# Patient Record
Sex: Female | Born: 1980 | Race: White | Hispanic: No | Marital: Married | Smoking: Never smoker
Health system: Southern US, Community
[De-identification: ages and names within clinical notes are randomized; demographics above are authoritative.]

## PROBLEM LIST (undated history)

## (undated) DIAGNOSIS — R112 Nausea with vomiting, unspecified: Secondary | ICD-10-CM

## (undated) DIAGNOSIS — Z1589 Genetic susceptibility to other disease: Secondary | ICD-10-CM

## (undated) DIAGNOSIS — E7212 Methylenetetrahydrofolate reductase deficiency: Secondary | ICD-10-CM

## (undated) DIAGNOSIS — R87629 Unspecified abnormal cytological findings in specimens from vagina: Secondary | ICD-10-CM

## (undated) DIAGNOSIS — Z9889 Other specified postprocedural states: Secondary | ICD-10-CM

## (undated) HISTORY — DX: Other specified postprocedural states: Z98.890

## (undated) HISTORY — PX: DILATION AND CURETTAGE OF UTERUS: SHX78

## (undated) HISTORY — DX: Methylenetetrahydrofolate reductase deficiency: E72.12

## (undated) HISTORY — DX: Unspecified abnormal cytological findings in specimens from vagina: R87.629

## (undated) HISTORY — DX: Genetic susceptibility to other disease: Z15.89

## (undated) HISTORY — DX: Nausea with vomiting, unspecified: R11.2

## (undated) HISTORY — PX: APPENDECTOMY: SHX54

## (undated) HISTORY — PX: LEEP: SHX91

---

## 2016-01-06 LAB — OB RESULTS CONSOLE ANTIBODY SCREEN: Antibody Screen: NEGATIVE

## 2016-01-06 LAB — OB RESULTS CONSOLE ABO/RH: RH TYPE: POSITIVE

## 2016-01-06 LAB — OB RESULTS CONSOLE HEPATITIS B SURFACE ANTIGEN: Hepatitis B Surface Ag: NEGATIVE

## 2016-01-06 LAB — OB RESULTS CONSOLE RUBELLA ANTIBODY, IGM: Rubella: IMMUNE

## 2016-01-06 LAB — OB RESULTS CONSOLE GC/CHLAMYDIA
Chlamydia: NEGATIVE
Gonorrhea: NEGATIVE

## 2016-01-06 LAB — OB RESULTS CONSOLE RPR: RPR: NONREACTIVE

## 2016-01-06 LAB — OB RESULTS CONSOLE HIV ANTIBODY (ROUTINE TESTING): HIV: NONREACTIVE

## 2016-06-21 NOTE — L&D Delivery Note (Signed)
Delivery Note At 9:32 PM a viable and healthy female was delivered via Vaginal, Spontaneous Delivery (Presentation: LOA  ).  APGAR: 8, 9; weight  pending.   Placenta status: spontaneous , intact.  Cord:  with the following complications: .  Cord pH: na  Anesthesia: local  Episiotomy: Median- maternal request Lacerations: 2nd degree;Perineal Suture Repair: 2.0 vicryl rapide Est. Blood Loss (mL):  250  Mom to postpartum.  Baby to Couplet care / Skin to Skin.  Nicci Vaughan J 08/03/2016, 9:52 PM

## 2016-07-02 LAB — OB RESULTS CONSOLE GBS: STREP GROUP B AG: NEGATIVE

## 2016-08-02 ENCOUNTER — Encounter (HOSPITAL_COMMUNITY): Payer: Self-pay | Admitting: *Deleted

## 2016-08-02 ENCOUNTER — Telehealth (HOSPITAL_COMMUNITY): Payer: Self-pay | Admitting: *Deleted

## 2016-08-02 ENCOUNTER — Other Ambulatory Visit: Payer: Self-pay | Admitting: Obstetrics

## 2016-08-02 NOTE — Telephone Encounter (Signed)
Preadmission screen  

## 2016-08-03 ENCOUNTER — Encounter (HOSPITAL_COMMUNITY): Payer: Self-pay | Admitting: *Deleted

## 2016-08-03 ENCOUNTER — Inpatient Hospital Stay (HOSPITAL_COMMUNITY)
Admission: AD | Admit: 2016-08-03 | Discharge: 2016-08-05 | DRG: 775 | Disposition: A | Discharge status: Home / Self Care | Payer: BC Managed Care – PPO | Source: Ambulatory Visit | Attending: Obstetrics and Gynecology | Admitting: Obstetrics and Gynecology

## 2016-08-03 DIAGNOSIS — Z3A40 40 weeks gestation of pregnancy: Secondary | ICD-10-CM

## 2016-08-03 DIAGNOSIS — O4292 Full-term premature rupture of membranes, unspecified as to length of time between rupture and onset of labor: Secondary | ICD-10-CM | POA: Diagnosis present

## 2016-08-03 DIAGNOSIS — O4290 Premature rupture of membranes, unspecified as to length of time between rupture and onset of labor, unspecified weeks of gestation: Secondary | ICD-10-CM | POA: Diagnosis present

## 2016-08-03 LAB — CBC
HEMATOCRIT: 38.3 % (ref 36.0–46.0)
HEMOGLOBIN: 13.3 g/dL (ref 12.0–15.0)
MCH: 30.9 pg (ref 26.0–34.0)
MCHC: 34.7 g/dL (ref 30.0–36.0)
MCV: 88.9 fL (ref 78.0–100.0)
Platelets: 167 10*3/uL (ref 150–400)
RBC: 4.31 MIL/uL (ref 3.87–5.11)
RDW: 13.2 % (ref 11.5–15.5)
WBC: 11.5 10*3/uL — ABNORMAL HIGH (ref 4.0–10.5)

## 2016-08-03 LAB — TYPE AND SCREEN
ABO/RH(D): O POS
Antibody Screen: NEGATIVE

## 2016-08-03 MED ORDER — SIMETHICONE 80 MG PO CHEW: 80.0000 mg | CHEWABLE_TABLET | ORAL | Status: DC | PRN

## 2016-08-03 MED ORDER — ONDANSETRON HCL 4 MG/2ML IJ SOLN: 4.0000 mg | Freq: Four times a day (QID) | INTRAMUSCULAR | Status: DC | PRN

## 2016-08-03 MED ORDER — PRENATAL MULTIVITAMIN CH
1.0000 | ORAL_TABLET | Freq: Every day | ORAL | Status: DC
  Filled 2016-08-03: qty 1

## 2016-08-03 MED ORDER — SENNOSIDES-DOCUSATE SODIUM 8.6-50 MG PO TABS
2.0000 | ORAL_TABLET | ORAL | Status: DC
  Administered 2016-08-04 (×2): 2 via ORAL
  Filled 2016-08-03 (×2): qty 2

## 2016-08-03 MED ORDER — ACETAMINOPHEN 325 MG PO TABS
650.0000 mg | ORAL_TABLET | ORAL | Status: DC | PRN
Start: 2016-08-03 — End: 2016-08-05
  Administered 2016-08-04 (×2): 650 mg via ORAL
  Filled 2016-08-03 (×2): qty 2

## 2016-08-03 MED ORDER — OXYCODONE-ACETAMINOPHEN 5-325 MG PO TABS: 1.0000 | ORAL_TABLET | ORAL | Status: DC | PRN

## 2016-08-03 MED ORDER — OXYCODONE-ACETAMINOPHEN 5-325 MG PO TABS: 2.0000 | ORAL_TABLET | ORAL | Status: DC | PRN

## 2016-08-03 MED ORDER — IBUPROFEN 600 MG PO TABS
600.0000 mg | ORAL_TABLET | Freq: Four times a day (QID) | ORAL | Status: DC
  Administered 2016-08-04 – 2016-08-05 (×7): 600 mg via ORAL
  Filled 2016-08-03 (×7): qty 1

## 2016-08-03 MED ORDER — OXYTOCIN 40 UNITS IN LACTATED RINGERS INFUSION - SIMPLE MED
1.0000 m[IU]/min | INTRAVENOUS | Status: DC
  Administered 2016-08-03: 1 m[IU]/min via INTRAVENOUS

## 2016-08-03 MED ORDER — OXYTOCIN 40 UNITS IN LACTATED RINGERS INFUSION - SIMPLE MED
2.5000 [IU]/h | INTRAVENOUS | Status: DC
  Administered 2016-08-03: 39.96 [IU]/h via INTRAVENOUS
  Filled 2016-08-03: qty 1000

## 2016-08-03 MED ORDER — ONDANSETRON HCL 4 MG PO TABS: 4.0000 mg | ORAL_TABLET | ORAL | Status: DC | PRN

## 2016-08-03 MED ORDER — METHYLERGONOVINE MALEATE 0.2 MG/ML IJ SOLN: 0.2000 mg | INTRAMUSCULAR | Status: DC | PRN

## 2016-08-03 MED ORDER — ACETAMINOPHEN 325 MG PO TABS: 650.0000 mg | ORAL_TABLET | ORAL | Status: DC | PRN

## 2016-08-03 MED ORDER — OXYTOCIN BOLUS FROM INFUSION: 500.0000 mL | Freq: Once | INTRAVENOUS | Status: DC

## 2016-08-03 MED ORDER — METHYLERGONOVINE MALEATE 0.2 MG PO TABS: 0.2000 mg | ORAL_TABLET | ORAL | Status: DC | PRN

## 2016-08-03 MED ORDER — LACTATED RINGERS IV SOLN
INTRAVENOUS | Status: DC
  Administered 2016-08-03: 19:00:00 via INTRAVENOUS

## 2016-08-03 MED ORDER — TETANUS-DIPHTH-ACELL PERTUSSIS 5-2.5-18.5 LF-MCG/0.5 IM SUSP: 0.5000 mL | Freq: Once | INTRAMUSCULAR | Status: DC

## 2016-08-03 MED ORDER — LACTATED RINGERS IV SOLN: 500.0000 mL | INTRAVENOUS | Status: DC | PRN

## 2016-08-03 MED ORDER — TERBUTALINE SULFATE 1 MG/ML IJ SOLN
0.2500 mg | Freq: Once | INTRAMUSCULAR | Status: DC | PRN
  Filled 2016-08-03: qty 1

## 2016-08-03 MED ORDER — SOD CITRATE-CITRIC ACID 500-334 MG/5ML PO SOLN: 30.0000 mL | ORAL | Status: DC | PRN

## 2016-08-03 MED ORDER — WITCH HAZEL-GLYCERIN EX PADS
1.0000 "application " | MEDICATED_PAD | CUTANEOUS | Status: DC | PRN
  Administered 2016-08-04: 1 via TOPICAL

## 2016-08-03 MED ORDER — DIPHENHYDRAMINE HCL 25 MG PO CAPS: 25.0000 mg | ORAL_CAPSULE | Freq: Four times a day (QID) | ORAL | Status: DC | PRN

## 2016-08-03 MED ORDER — LIDOCAINE HCL (PF) 1 % IJ SOLN
30.0000 mL | INTRAMUSCULAR | Status: DC | PRN
  Administered 2016-08-03: 30 mL via SUBCUTANEOUS
  Filled 2016-08-03: qty 30

## 2016-08-03 MED ORDER — ZOLPIDEM TARTRATE 5 MG PO TABS: 5.0000 mg | ORAL_TABLET | Freq: Every evening | ORAL | Status: DC | PRN

## 2016-08-03 MED ORDER — DIBUCAINE 1 % RE OINT
1.0000 "application " | TOPICAL_OINTMENT | RECTAL | Status: DC | PRN
  Administered 2016-08-04: 1 via RECTAL
  Filled 2016-08-03: qty 28

## 2016-08-03 MED ORDER — COCONUT OIL OIL
1.0000 "application " | TOPICAL_OIL | Status: DC | PRN
  Administered 2016-08-05: 1 via TOPICAL
  Filled 2016-08-03: qty 120

## 2016-08-03 MED ORDER — BENZOCAINE-MENTHOL 20-0.5 % EX AERO
1.0000 "application " | INHALATION_SPRAY | CUTANEOUS | Status: DC | PRN
  Administered 2016-08-04 – 2016-08-05 (×2): 1 via TOPICAL
  Filled 2016-08-03 (×2): qty 56

## 2016-08-03 MED ORDER — ONDANSETRON HCL 4 MG/2ML IJ SOLN: 4.0000 mg | INTRAMUSCULAR | Status: DC | PRN

## 2016-08-03 NOTE — H&P (Addendum)
Betty Reed is a 36 y.o. female presenting for SROM at term. Uncomplicated pregnancy. GBS negative. OB History    Gravida Para Term Preterm AB Living   4 0 0   3 0   SAB TAB Ectopic Multiple Live Births   3             Past Medical History:  Diagnosis Date  . 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 PONV (postoperative nausea and vomiting)   . Vaginal Pap smear, abnormal    Past Surgical History:  Procedure Laterality Date  . APPENDECTOMY    . DILATION AND CURETTAGE OF UTERUS    . LEEP     Family History: family history includes Diabetes in her paternal grandfather; Hypertension in her father. Social History:  reports that she has never smoked. She has never used smokeless tobacco. She reports that she does not drink alcohol or use drugs.     Maternal Diabetes: No Genetic Screening: Normal Maternal Ultrasounds/Referrals: Normal Fetal Ultrasounds or other Referrals:  None Maternal Substance Abuse:  No Significant Maternal Medications:  None Significant Maternal Lab Results:  None Other Comments:  None  Review of Systems  Constitutional: Negative.   All other systems reviewed and are negative.  Maternal Medical History:  Reason for admission: Rupture of membranes.   Contractions: Onset was 3-5 hours ago.   Frequency: irregular.   Perceived severity is mild.    Fetal activity: Perceived fetal activity is normal.   Last perceived fetal movement was within the past hour.    Prenatal complications: no prenatal complications Prenatal Complications - Diabetes: none.      Blood pressure 134/85, pulse 97, temperature 97.5 F (36.4 C), temperature source Oral, resp. rate 18, height 5\' 7"  (1.702 m), weight 89.8 kg (198 lb). Maternal Exam:  Uterine Assessment: Contraction strength is mild.  Contraction frequency is irregular.   Abdomen: Patient reports no abdominal tenderness. Fetal presentation: vertex  Introitus: Normal vulva. Normal vagina.  Ferning test:  positive.  Nitrazine test: positive. Amniotic fluid character: meconium stained.  Pelvis: adequate for delivery.   Cervix: Cervix evaluated by digital exam.     Physical Exam  Nursing note and vitals reviewed. Constitutional: She is oriented to person, place, and time. She appears well-developed and well-nourished.  HENT:  Head: Normocephalic and atraumatic.  Neck: Normal range of motion. Neck supple.  Cardiovascular: Normal rate and regular rhythm.   Respiratory: Effort normal and breath sounds normal.  GI: Soft. Bowel sounds are normal.  Genitourinary: Vagina normal and uterus normal.  Musculoskeletal: Normal range of motion.  Neurological: She is alert and oriented to person, place, and time.  Skin: Skin is warm and dry.  Psychiatric: She has a normal mood and affect.    Prenatal labs: ABO, Rh: --/--/O POS (02/13 1300) Antibody: NEG (02/13 1300) Rubella: Immune (07/18 0000) RPR: Nonreactive (07/18 0000)  HBsAg: Negative (07/18 0000)  HIV: Non-reactive (07/18 0000)  GBS: Negative (01/12 0000)   Assessment/Plan: SROM - meconium at term GBS negative. Category 1 tracing Declines augmentation at this time Delayed management of SROM at tem with light meconium with slight inc in chorio noted although there is no evidence that immediate IOL will reduce this complication. Will allow expectant management at this time.  Kiree Dejarnette J 08/03/2016, 4:39 PM

## 2016-08-03 NOTE — Anesthesia Pain Management Evaluation Note (Signed)
  CRNA Pain Management Visit Note  Patient: Betty Reed, 36 y.o., female  "Hello I am a member of the anesthesia team at [facility]. We have an anesthesia team available at all times to provide care throughout the hospital, including epidural management and anesthesia for C-section. I don't know your plan for the delivery whether it a natural birth, water birth, IV sedation, nitrous supplementation, doula or epidural, but we want to meet your pain goals."   1.Was your pain managed to your expectations on prior hospitalizations?   No prior hospitalizations  2.What is your expectation for pain management during this hospitalization?     Labor support without medications  3.How can we help you reach that goal? Patient wishes a natural childbirth.  Patient knows all options and has no questions.  I advised the patient to let her L&D RN know if she needs anything related to pain control.  Record the patient's initial score and the patient's pain goal.   Pain: 0  Pain Goal: 10 [facility] wants you to be able to say your pain was always managed very well.  Tisa Weisel L 08/03/2016

## 2016-08-03 NOTE — Progress Notes (Signed)
Betty Reed is a 36 y.o. G4P0030 at 3334w4d by LMP admitted for active labor, rupture of membranes  Subjective: comfortable  Objective: BP (!) 139/92   Pulse 89   Temp 98.4 F (36.9 C) (Oral)   Resp 18   Ht 5\' 7"  (1.702 m)   Wt 89.8 kg (198 lb)   BMI 31.01 kg/m  No intake/output data recorded. No intake/output data recorded.  FHT:  FHR: 155 bpm, variability: moderate,  accelerations:  Present,  decelerations:  Absent UC:   irregular, every 7 minutes SVE:   Dilation: 5 Effacement (%): 90 Station: -2 Exam by:: J.Follmer,RNC  Labs: Lab Results  Component Value Date   WBC 11.5 (H) 08/03/2016   HGB 13.3 08/03/2016   HCT 38.3 08/03/2016   MCV 88.9 08/03/2016   PLT 167 08/03/2016    Assessment / Plan: Protracted latent phase  Labor: accepts Pitocin Preeclampsia:  no signs or symptoms of toxicity Fetal Wellbeing:  Category I Pain Control:  Labor support without medications I/D:  n/a Anticipated MOD:  NSVD  Betty Reed J 08/03/2016, 6:33 PM

## 2016-08-04 ENCOUNTER — Encounter (HOSPITAL_COMMUNITY): Payer: Self-pay

## 2016-08-04 LAB — CBC
HCT: 32.2 % — ABNORMAL LOW (ref 36.0–46.0)
HEMOGLOBIN: 11.1 g/dL — AB (ref 12.0–15.0)
MCH: 30.6 pg (ref 26.0–34.0)
MCHC: 34.5 g/dL (ref 30.0–36.0)
MCV: 88.7 fL (ref 78.0–100.0)
PLATELETS: 156 10*3/uL (ref 150–400)
RBC: 3.63 MIL/uL — ABNORMAL LOW (ref 3.87–5.11)
RDW: 13.3 % (ref 11.5–15.5)
WBC: 12.9 10*3/uL — ABNORMAL HIGH (ref 4.0–10.5)

## 2016-08-04 LAB — ABO/RH: ABO/RH(D): O POS

## 2016-08-04 LAB — RPR: RPR Ser Ql: NONREACTIVE

## 2016-08-04 NOTE — Progress Notes (Signed)
Patient ID: Burley SaverAmber Ballew, female   DOB: 08/31/1980, 36 y.o.   MRN: [id number] PPD # 1 SVD  S:  Reports feeling well.             Tolerating po/ No nausea or vomiting             Bleeding is light             Pain controlled with ibuprofen (OTC)             Up ad lib / ambulatory / voiding without difficulties     Information for the patient's newborn:  Tinnie GensBryant, Girl Meeya [[id number]][[id number]]  female "[name] Juanita CraverGrey"   breast feeding    O:  A & O x 3, in no apparent distress              VS:  Vitals:   08/04/16 0041 08/04/16 0440 08/04/16 0555 08/04/16 0940  BP: 127/69 (!) 108/56 134/78 124/69  Pulse: 93 78 70 72  Resp: 18 16 18 18   Temp: 98.3 F (36.8 C) 98.1 F (36.7 C) 97.8 F (36.6 C) 98.1 F (36.7 C)  TempSrc: Axillary Oral Oral Oral  SpO2: 98% 97%  98%  Weight:      Height:        LABS:  Recent Labs  08/03/16 1300 08/04/16 0520  WBC 11.5* 12.9*  HGB 13.3 11.1*  HCT 38.3 32.2*  PLT 167 156    Blood type: O POS (02/13 1300)  Rubella: Immune (07/18 0000)   I&O: I/O last 3 completed shifts: In: -  Out: 450 [Urine:250; Blood:200]          No intake/output data recorded.    Abdomen: soft, non-tender, non-distended             Fundus: firm, non-tender, U-1  Perineum: median episiotomy repair healing well, mild edema  Lochia: minimal  Extremities: No edema, no calf pain or tenderness    A/P: PPD # 1 35 y.o., [id number]   Principal Problem:   Postpartum care following vaginal delivery (2/13) Active Problems:   Delayed delivery after SROM (spontaneous rupture of membranes)   Spontaneous vaginal delivery   Second degree perineal laceration during delivery    Doing well - stable status  Routine post partum orders  Anticipate discharge tomorrow    Raelyn MoraAWSON, Clydine Parkison, M, MSN, CNM 08/04/2016, 8:45 AM

## 2016-08-04 NOTE — Lactation Note (Signed)
This note was copied from a baby's chart. Lactation Consultation Note  Patient Name: Betty Reed Today's Date: 08/04/2016 Reason for consult: Initial assessment  Baby is 17 hours old and has several short attempts to the breast.  @ this consult baby was waking up and showing feeding cues. Mom and dad mentioned the previous feedings baby has been ob and off. LC changed a medium mec stool after 1st attempt  See doc flow sheets for details of attempts. The longest feeding with LC was 5 mins with swallows. LC checked baby's mouth and noted a high palate and at the latch had to assist mom to obtain the depth and once depth was achieved baby latched with few swallows.  LC discussed with mom and dad we may have to use a Nipple Shield for latching if her feeding patterns continue to be on and off. This LC feels the baby is getting the idea of latching and opening her mouth better which each attempt. LC noted the baby to be gasey, and slow to burp. Baby seemed more comfortable in the cross cradle or laid back. Towards the end of working with baby , she seemed like she wasn't interested in feeding. Baby STS on moms chest with a baby blanket covering her back.   Per mom and dad both attended the breast feeding class and are both receptive to teaching.   Mother informed of post-discharge support and given phone number to the lactation department, including services for phone call assistance; out-patient appointments; and breastfeeding support group. List of other breastfeeding resources in the community given in the handout. Encouraged mother to call for problems or concerns related to breastfeeding.    Maternal Data Has patient been taught Hand Expression?: Yes Does the patient have breastfeeding experience prior to this delivery?: No  Feeding Feeding Type: Breast Fed (laid back right breast ) Length of feed: 2 min  LATCH Score/Interventions Latch: Repeated attempts needed to sustain latch,  nipple held in mouth throughout feeding, stimulation needed to elicit sucking reflex. Intervention(s): Adjust position;Assist with latch;Breast massage;Breast compression  Audible Swallowing: A few with stimulation Intervention(s): Alternate breast massage  Type of Nipple: Everted at rest and after stimulation  Comfort (Breast/Nipple): Soft / non-tender     Hold (Positioning): Assistance needed to correctly position infant at breast and maintain latch. Intervention(s): Breastfeeding basics reviewed;Support Pillows;Position options;Skin to skin  LATCH Score: 7  Lactation Tools Discussed/Used Tools: Pump (was given to mom earlier today by MBU RN to pre pump left breast- short shaft nipple ) Breast pump type: Manual WIC Program: No   Consult Status Consult Status: Follow-up (3-11 p LC aware the mom will call for assistance - see LC note ) Date: 08/04/16 Follow-up type: In-patient    Betty Reed 08/04/2016, 3:35 PM

## 2016-08-04 NOTE — Lactation Note (Signed)
This note was copied from a baby's chart. Lactation Consultation Note  Patient Name: Girl Burley Savermber Houde ZOXWR'UToday's Date: 08/04/2016 Reason for consult: Follow-up assessment Baby at 24 hr of life and parents are worried that baby has not eaten. Upon entry baby was sts with mom and crying. Applied #20 NS to the R breast and baby latched for about 5 minutes. Switched baby to the L breast were she did another 3 minutes then got the hiccups. Mom's back was hurting, so Dad took the baby while Mom was going for a walk. Discussed baby behavior, feeding frequency, baby belly size, voids, wt loss, breast changes, and nipple care. If baby does not pick up on feedings mom can manually express into the NS or place a small amount of formula in the NS to supplement baby at the breast. Report given to RN.     Maternal Data    Feeding Feeding Type: Breast Fed Length of feed: 8 min  LATCH Score/Interventions Latch: Repeated attempts needed to sustain latch, nipple held in mouth throughout feeding, stimulation needed to elicit sucking reflex. Intervention(s): Adjust position;Assist with latch;Breast massage;Breast compression  Audible Swallowing: None Intervention(s): Hand expression;Skin to skin Intervention(s): Alternate breast massage  Type of Nipple: Everted at rest and after stimulation  Comfort (Breast/Nipple): Soft / non-tender     Hold (Positioning): Assistance needed to correctly position infant at breast and maintain latch.  LATCH Score: 6  Lactation Tools Discussed/Used Tools: Nipple Shields Nipple shield size: 20   Consult Status Consult Status: Follow-up Date: 08/05/16 Follow-up type: In-patient    Rulon Eisenmengerlizabeth E Marlis Oldaker 08/04/2016, 10:29 PM

## 2016-08-05 MED ORDER — IBUPROFEN 600 MG PO TABS: 600.0000 mg | ORAL_TABLET | Freq: Four times a day (QID) | ORAL | 0 refills | Status: DC

## 2016-08-05 MED ORDER — BENZOCAINE-MENTHOL 20-0.5 % EX AERO: 1.0000 "application " | INHALATION_SPRAY | CUTANEOUS | Status: DC | PRN

## 2016-08-05 MED ORDER — COCONUT OIL OIL: 1.0000 "application " | TOPICAL_OIL | 0 refills | Status: DC | PRN

## 2016-08-05 NOTE — Lactation Note (Signed)
This note was copied from a baby's chart. Lactation Consultation Note  Patient Name: Betty Reed's Date: 08/05/2016 Reason for consult: Follow-up assessment;Difficult latch  Baby 40 hours old. Mom latching baby when this LC entered the room. Baby very fussy at the breast. Refitted mom with a #24 NS and mom reported increased comfort. Baby able to latch and suckle a few minutes with #24, but baby too fussy to continue suckling. Mom has wide-spaced, v-shaped breast. Baby has a high palate and chomps while suckling--at breast and with LC's gloved finger. Discussed with parents that this can resolve, especially as mom's milk comes to volume. Enc parents to discuss with pediatrician if latching continues to be an issue. Discussed with mom that volume of EBM flowing seems to be the issue right now.   Assisted parents with hand expression and baby spoon-fed 4 ml of EBM. Baby tolerated well and mom easily expressible. Parents report that they are being D/C'd home, and mom has DEBP at home. Enc mom to begin putting the baby to breast with cues, then supplementing baby with EBM/formula according to supplementation guidelines--which were given with review. Enc mom to post-pump with DEBP followed by hand expression. Discussed the need to continue pumping as long as mom is using NS. Parents given formula and written plan. Parents enc to make OP appointment with Lactation, and mom reports that she will call for appointment.   Discussed assessment and interventions with patient's bedside nurse, Marcelino DusterMichelle, RN.  Maternal Data    Feeding Feeding Type: Breast Fed Length of feed: 3 min  LATCH Score/Interventions Latch: Repeated attempts needed to sustain latch, nipple held in mouth throughout feeding, stimulation needed to elicit sucking reflex. Intervention(s): Adjust position;Assist with latch  Audible Swallowing: None Intervention(s): Skin to skin;Hand expression  Type of Nipple: Everted at rest  and after stimulation  Comfort (Breast/Nipple): Soft / non-tender     Hold (Positioning): Assistance needed to correctly position infant at breast and maintain latch. Intervention(s): Breastfeeding basics reviewed;Support Pillows;Position options;Skin to skin  LATCH Score: 6  Lactation Tools Discussed/Used Tools: Nipple Shields Nipple shield size: 24 Pump Review: Milk Storage Initiated by:: Enc mom to start pumping at home--mom being D/C'd.   Consult Status Consult Status: PRN    Sherlyn HayJennifer D Ambrea Hegler 08/05/2016, 1:35 PM

## 2016-08-05 NOTE — Progress Notes (Signed)
Post Partum Day #2           Information for the patient's newborn:  Tinnie GensBryant, Girl Donovan [[id number]][[id number]]  female  Baby name: [name] Feeding: breast  Subjective: No HA, SOB, CP, F/C, breast symptoms. Pain controlled w/ ibuprofen. Normal vaginal bleeding, no clots.      Objective:  VS:  Vitals:   08/04/16 0555 08/04/16 0940 08/04/16 1733 08/05/16 0635  BP: 134/78 124/69 118/64 121/71  Pulse: 70 72 74 72  Resp: 18 18 18 18   Temp: 97.8 F (36.6 C) 98.1 F (36.7 C) 98.3 F (36.8 C) 98.4 F (36.9 C)  TempSrc: Oral Oral Oral Oral  SpO2:  98%    Weight:      Height:        No intake or output data in the 24 hours ending 08/05/16 1222     Recent Labs  08/03/16 1300 08/04/16 0520  WBC 11.5* 12.9*  HGB 13.3 11.1*  HCT 38.3 32.2*  PLT 167 156    Blood type: --/--/O POS, O POS (02/13 1300) Rubella: Immune (07/18 0000)    Physical Exam:  General: alert, cooperative and no distress Uterine Fundus: firm Lochia: appropriate Perineum: repair intact, edema mild DVT Evaluation: No cords or calf tenderness. No significant calf/ankle edema.    Assessment/Plan: PPD # 2 / 36 y.o., [id number] S/P:spontaneous vaginal   Principal Problem:   Postpartum care following vaginal delivery (2/13) Active Problems:   Spontaneous vaginal delivery   Second degree perineal laceration during delivery    normal postpartum exam  Continue current postpartum care             DC home today w/ instructions  [facility] and PRN   LOS: 2 days   Neta Mendsaniela C Brean Carberry, CNM, MSN 08/05/2016, 12:22 PM

## 2016-08-05 NOTE — Discharge Summary (Signed)
Obstetric Discharge Summary Reason for Admission: onset of labor and rupture of membranes Prenatal Procedures: ultrasound Intrapartum Procedures: spontaneous vaginal delivery Postpartum Procedures: none Complications-Operative and Postpartum: 2nd degree perineal laceration Hemoglobin  Date Value Ref Range Status  08/04/2016 11.1 (L) 12.0 - 15.0 g/dL Final   HCT  Date Value Ref Range Status  08/04/2016 32.2 (L) 36.0 - 46.0 % Final    Physical Exam:  General: alert, cooperative and no distress Lochia: appropriate Uterine Fundus: firm Incision: healing well DVT Evaluation: No cords or calf tenderness. No significant calf/ankle edema.  Discharge Diagnoses: Term Pregnancy-delivered  Discharge Information: Date: 08/05/2016 Activity: pelvic rest Diet: routine Medications: PNV and Ibuprofen Condition: stable Instructions: refer to practice specific booklet Discharge to: home Follow-up Information    [facility]. Schedule an appointment as soon as possible for a visit in 6 week(s).   Specialty:  Obstetrics and Gynecology Contact information: [address] [place] [address] [phone]           Newborn Data: Live born female [name] Birth Weight: 7 lb 15.4 oz (3612 g) APGAR: 8, 9  Home with mother.  Betty Reed 08/05/2016, 12:32 PM

## 2016-08-13 ENCOUNTER — Inpatient Hospital Stay (HOSPITAL_COMMUNITY): Admission: RE | Admit: 2016-08-13 | Payer: BC Managed Care – PPO | Source: Ambulatory Visit

## 2017-06-21 NOTE — L&D Delivery Note (Signed)
Delivery Note At 4:53 PM a viable female was delivered via Vaginal, Spontaneous (Presentation: DOA;  ).  APGAR: 9, 9; weight  .pendind   Placenta status: ijntact, spontaneous, .  Cord:  3VC, nuchal x 1 reduced at perineum, body x 1 rotated and delivered through, with the following complications: none.  Cord pH: n/a  Anesthesia:  epidural Episiotomy:  none Lacerations:  2nd Suture Repair: 3.0 vicryl rapide Est. Blood Loss (mL):  300  Mom to postpartum.  Baby to Couplet care / Skin to Skin.  Lendon ColonelKelly A Ommie Degeorge 05/30/2018, 5:08 PM

## 2017-11-07 LAB — OB RESULTS CONSOLE ABO/RH: RH Type: POSITIVE

## 2017-11-07 LAB — OB RESULTS CONSOLE ANTIBODY SCREEN: Antibody Screen: NEGATIVE

## 2017-11-07 LAB — OB RESULTS CONSOLE HEPATITIS B SURFACE ANTIGEN: HEP B S AG: NEGATIVE

## 2017-11-07 LAB — OB RESULTS CONSOLE HIV ANTIBODY (ROUTINE TESTING): HIV: NONREACTIVE

## 2017-11-07 LAB — OB RESULTS CONSOLE GC/CHLAMYDIA
CHLAMYDIA, DNA PROBE: NEGATIVE
Gonorrhea: NEGATIVE

## 2017-11-07 LAB — OB RESULTS CONSOLE RUBELLA ANTIBODY, IGM: RUBELLA: IMMUNE

## 2017-11-07 LAB — OB RESULTS CONSOLE RPR: RPR: NONREACTIVE

## 2018-05-05 LAB — OB RESULTS CONSOLE GBS: GBS: NEGATIVE

## 2018-05-22 ENCOUNTER — Telehealth (HOSPITAL_COMMUNITY): Payer: Self-pay | Admitting: *Deleted

## 2018-05-22 ENCOUNTER — Encounter (HOSPITAL_COMMUNITY): Payer: Self-pay | Admitting: *Deleted

## 2018-05-22 NOTE — Telephone Encounter (Signed)
Preadmission screen  

## 2018-05-25 ENCOUNTER — Other Ambulatory Visit: Payer: Self-pay | Admitting: Obstetrics

## 2018-05-29 NOTE — H&P (Signed)
Betty Reed is a 37 y.o. [id number] at 923w4d presenting for IOL. Pt notes rare contractions. Good fetal movement, No vaginal bleeding, not leaking fluid   [facility] - dated by 5 and 8 wk u/s - h/o RPL, MTHFR homozygous, on baby ASA through preg - AMA, nl Panorama   Prenatal Transfer Tool  Maternal Diabetes: No Genetic Screening: Normal Maternal Ultrasounds/Referrals: Normal Fetal Ultrasounds or other Referrals:  None Maternal Substance Abuse:  No Significant Maternal Medications:  None Significant Maternal Lab Results: None     OB History    Gravida  5   Para  1   Term  1   Preterm      AB  3   Living  1     SAB  3   TAB      Ectopic      Multiple  0   Live Births  1          Past Medical History:  Diagnosis Date  . 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 PONV (postoperative nausea and vomiting)   . Postpartum care following vaginal delivery (2/13) 08/04/2016  . Second degree perineal laceration during delivery 08/04/2016  . Spontaneous vaginal delivery 08/04/2016  . Vaginal Pap smear, abnormal    Past Surgical History:  Procedure Laterality Date  . APPENDECTOMY    . DILATION AND CURETTAGE OF UTERUS    . LEEP     Family History: family history includes Diabetes in her paternal grandfather; Hypertension in her father. Social History:  reports that she has never smoked. She has never used smokeless tobacco. She reports that she does not drink alcohol or use drugs.  Review of Systems - Negative except discomfort of pregnancy   Physical Exam  Prenatal labs: ABO, Rh: O/Positive/-- (05/20 0000) Antibody: n (05/20 0000) Rubella: Immune (05/20 0000) RPR: Nonreactive (05/20 0000)  HBsAg: Negative (05/20 0000)  HIV: Non-reactive (05/20 0000)  GBS: Negative (11/15 0000)  1 hr Glucola 107  Genetic screening nl Panorama, nl AFP Anatomy US normal  CBC    Component Value Date/Time   WBC 12.9 (H) 08/04/2016 0520   RBC  3.63 (L) 08/04/2016 0520   HGB 11.1 (L) 08/04/2016 0520   HCT 32.2 (L) 08/04/2016 0520   PLT 156 08/04/2016 0520   MCV 88.7 08/04/2016 0520   MCH 30.6 08/04/2016 0520   MCHC 34.5 08/04/2016 0520   RDW 13.3 08/04/2016 0520     Assessment/Plan: 37 y.o. [id number] at 7423w4d - Elective IOL at term. Pt aware R/B. Plan pitocin, AROM when active - OK for epidural

## 2018-05-30 ENCOUNTER — Inpatient Hospital Stay (HOSPITAL_COMMUNITY): Payer: BC Managed Care – PPO | Admitting: Anesthesiology

## 2018-05-30 ENCOUNTER — Inpatient Hospital Stay (HOSPITAL_COMMUNITY)
Admission: RE | Admit: 2018-05-30 | Discharge: 2018-06-01 | DRG: 807 | Disposition: A | Discharge status: Home / Self Care | Payer: BC Managed Care – PPO | Attending: Obstetrics | Admitting: Obstetrics

## 2018-05-30 ENCOUNTER — Encounter (HOSPITAL_COMMUNITY): Payer: Self-pay

## 2018-05-30 DIAGNOSIS — Z3490 Encounter for supervision of normal pregnancy, unspecified, unspecified trimester: Secondary | ICD-10-CM | POA: Diagnosis present

## 2018-05-30 DIAGNOSIS — Z3A39 39 weeks gestation of pregnancy: Secondary | ICD-10-CM | POA: Diagnosis not present

## 2018-05-30 DIAGNOSIS — O26893 Other specified pregnancy related conditions, third trimester: Secondary | ICD-10-CM | POA: Diagnosis present

## 2018-05-30 LAB — TYPE AND SCREEN
ABO/RH(D): O POS
Antibody Screen: NEGATIVE

## 2018-05-30 LAB — CBC
HCT: 38.4 % (ref 36.0–46.0)
HEMOGLOBIN: 12.8 g/dL (ref 12.0–15.0)
MCH: 30.8 pg (ref 26.0–34.0)
MCHC: 33.3 g/dL (ref 30.0–36.0)
MCV: 92.5 fL (ref 80.0–100.0)
Platelets: 153 10*3/uL (ref 150–400)
RBC: 4.15 MIL/uL (ref 3.87–5.11)
RDW: 13.3 % (ref 11.5–15.5)
WBC: 6 10*3/uL (ref 4.0–10.5)
nRBC: 0 % (ref 0.0–0.2)

## 2018-05-30 LAB — RPR: RPR Ser Ql: NONREACTIVE

## 2018-05-30 MED ORDER — ONDANSETRON HCL 4 MG PO TABS: 4.0000 mg | ORAL_TABLET | ORAL | Status: DC | PRN

## 2018-05-30 MED ORDER — PHENYLEPHRINE 40 MCG/ML (10ML) SYRINGE FOR IV PUSH (FOR BLOOD PRESSURE SUPPORT)
80.0000 ug | PREFILLED_SYRINGE | INTRAVENOUS | Status: DC | PRN
  Filled 2018-05-30: qty 10

## 2018-05-30 MED ORDER — LACTATED RINGERS IV SOLN
INTRAVENOUS | Status: DC
  Administered 2018-05-30 (×2): via INTRAVENOUS

## 2018-05-30 MED ORDER — EPHEDRINE 5 MG/ML INJ
10.0000 mg | INTRAVENOUS | Status: DC | PRN
  Filled 2018-05-30: qty 2

## 2018-05-30 MED ORDER — SIMETHICONE 80 MG PO CHEW: 80.0000 mg | CHEWABLE_TABLET | ORAL | Status: DC | PRN

## 2018-05-30 MED ORDER — ONDANSETRON HCL 4 MG/2ML IJ SOLN: 4.0000 mg | Freq: Four times a day (QID) | INTRAMUSCULAR | Status: DC | PRN

## 2018-05-30 MED ORDER — LIDOCAINE HCL (PF) 1 % IJ SOLN: INTRAMUSCULAR | Status: DC | PRN

## 2018-05-30 MED ORDER — LIDOCAINE HCL (PF) 1 % IJ SOLN
30.0000 mL | INTRAMUSCULAR | Status: DC | PRN
  Filled 2018-05-30: qty 30

## 2018-05-30 MED ORDER — WITCH HAZEL-GLYCERIN EX PADS: 1.0000 "application " | MEDICATED_PAD | CUTANEOUS | Status: DC | PRN

## 2018-05-30 MED ORDER — DIPHENHYDRAMINE HCL 50 MG/ML IJ SOLN: 12.5000 mg | INTRAMUSCULAR | Status: DC | PRN

## 2018-05-30 MED ORDER — PHENYLEPHRINE 40 MCG/ML (10ML) SYRINGE FOR IV PUSH (FOR BLOOD PRESSURE SUPPORT)
80.0000 ug | PREFILLED_SYRINGE | INTRAVENOUS | Status: DC | PRN
  Filled 2018-05-30 (×2): qty 10

## 2018-05-30 MED ORDER — ZOLPIDEM TARTRATE 5 MG PO TABS: 5.0000 mg | ORAL_TABLET | Freq: Every evening | ORAL | Status: DC | PRN

## 2018-05-30 MED ORDER — OXYTOCIN 40 UNITS IN LACTATED RINGERS INFUSION - SIMPLE MED
1.0000 m[IU]/min | INTRAVENOUS | Status: DC
  Administered 2018-05-30: 2 m[IU]/min via INTRAVENOUS
  Filled 2018-05-30: qty 1000

## 2018-05-30 MED ORDER — DIBUCAINE 1 % RE OINT: 1.0000 "application " | TOPICAL_OINTMENT | RECTAL | Status: DC | PRN

## 2018-05-30 MED ORDER — COCONUT OIL OIL: 1.0000 "application " | TOPICAL_OIL | Status: DC | PRN

## 2018-05-30 MED ORDER — PRENATAL MULTIVITAMIN CH
1.0000 | ORAL_TABLET | Freq: Every day | ORAL | Status: DC
  Filled 2018-05-30 (×2): qty 1

## 2018-05-30 MED ORDER — SENNOSIDES-DOCUSATE SODIUM 8.6-50 MG PO TABS
2.0000 | ORAL_TABLET | ORAL | Status: DC
  Administered 2018-05-30 – 2018-05-31 (×2): 2 via ORAL
  Filled 2018-05-30 (×2): qty 2

## 2018-05-30 MED ORDER — TETANUS-DIPHTH-ACELL PERTUSSIS 5-2.5-18.5 LF-MCG/0.5 IM SUSP: 0.5000 mL | Freq: Once | INTRAMUSCULAR | Status: DC

## 2018-05-30 MED ORDER — IBUPROFEN 600 MG PO TABS
600.0000 mg | ORAL_TABLET | Freq: Four times a day (QID) | ORAL | Status: DC
  Administered 2018-05-30 – 2018-06-01 (×7): 600 mg via ORAL
  Filled 2018-05-30 (×8): qty 1

## 2018-05-30 MED ORDER — OXYTOCIN 40 UNITS IN LACTATED RINGERS INFUSION - SIMPLE MED
2.5000 [IU]/h | INTRAVENOUS | Status: DC
  Administered 2018-05-30: 2.5 [IU]/h via INTRAVENOUS

## 2018-05-30 MED ORDER — FENTANYL 2.5 MCG/ML BUPIVACAINE 1/10 % EPIDURAL INFUSION (WH - ANES)
14.0000 mL/h | INTRAMUSCULAR | Status: DC | PRN
  Administered 2018-05-30: 14 mL/h via EPIDURAL
  Filled 2018-05-30: qty 100

## 2018-05-30 MED ORDER — BENZOCAINE-MENTHOL 20-0.5 % EX AERO
1.0000 "application " | INHALATION_SPRAY | CUTANEOUS | Status: DC | PRN
  Administered 2018-05-30: 1 via TOPICAL
  Filled 2018-05-30: qty 56

## 2018-05-30 MED ORDER — OXYTOCIN BOLUS FROM INFUSION
500.0000 mL | Freq: Once | INTRAVENOUS | Status: AC
  Administered 2018-05-30: 500 mL via INTRAVENOUS

## 2018-05-30 MED ORDER — TERBUTALINE SULFATE 1 MG/ML IJ SOLN
0.2500 mg | Freq: Once | INTRAMUSCULAR | Status: DC | PRN
  Filled 2018-05-30: qty 1

## 2018-05-30 MED ORDER — LACTATED RINGERS IV SOLN: 500.0000 mL | Freq: Once | INTRAVENOUS | Status: DC

## 2018-05-30 MED ORDER — LACTATED RINGERS IV SOLN
500.0000 mL | INTRAVENOUS | Status: DC | PRN
  Administered 2018-05-30: 500 mL via INTRAVENOUS

## 2018-05-30 MED ORDER — OXYCODONE HCL 5 MG PO TABS: 5.0000 mg | ORAL_TABLET | ORAL | Status: DC | PRN

## 2018-05-30 MED ORDER — ONDANSETRON HCL 4 MG/2ML IJ SOLN: 4.0000 mg | INTRAMUSCULAR | Status: DC | PRN

## 2018-05-30 MED ORDER — LIDOCAINE-EPINEPHRINE (PF) 2 %-1:200000 IJ SOLN
INTRAMUSCULAR | Status: DC | PRN
  Administered 2018-05-30: 8 mL via EPIDURAL

## 2018-05-30 MED ORDER — DIPHENHYDRAMINE HCL 25 MG PO CAPS: 25.0000 mg | ORAL_CAPSULE | Freq: Four times a day (QID) | ORAL | Status: DC | PRN

## 2018-05-30 MED ORDER — ACETAMINOPHEN 325 MG PO TABS: 650.0000 mg | ORAL_TABLET | ORAL | Status: DC | PRN

## 2018-05-30 MED ORDER — SOD CITRATE-CITRIC ACID 500-334 MG/5ML PO SOLN: 30.0000 mL | ORAL | Status: DC | PRN

## 2018-05-30 MED ORDER — OXYCODONE HCL 5 MG PO TABS: 10.0000 mg | ORAL_TABLET | ORAL | Status: DC | PRN

## 2018-05-30 NOTE — Anesthesia Pain Management Evaluation Note (Signed)
  CRNA Pain Management Visit Note  Patient: Betty Reed, 37 y.o., female  "Hello I am a member of the anesthesia team at [facility]. We have an anesthesia team available at all times to provide care throughout the hospital, including epidural management and anesthesia for C-section. I don't know your plan for the delivery whether it a natural birth, water birth, IV sedation, nitrous supplementation, doula or epidural, but we want to meet your pain goals."   1.Was your pain managed to your expectations on prior hospitalizations?   Yes   2.What is your expectation for pain management during this hospitalization?     Epidural  3.How can we help you reach that goal? epidural  Record the patient's initial score and the patient's pain goal.   Pain: 0/10 Pain Goal: 3/10 [facility] wants you to be able to say your pain was always managed very well.  Betty Reed, Betty Reed 05/30/2018

## 2018-05-30 NOTE — Anesthesia Preprocedure Evaluation (Signed)
Anesthesia Evaluation  Patient identified by MRN, date of birth, ID band Patient awake    Reviewed: Allergy & Precautions, H&P , NPO status , Patient's Chart, lab work & pertinent test results, reviewed documented beta blocker date and time   Airway Mallampati: II  TM Distance: >3 FB Neck ROM: full    Dental no notable dental hx.    Pulmonary neg pulmonary ROS,    Pulmonary exam normal breath sounds clear to auscultation       Cardiovascular negative cardio ROS Normal cardiovascular exam Rhythm:regular Rate:Normal     Neuro/Psych negative neurological ROS  negative psych ROS   GI/Hepatic negative GI ROS, Neg liver ROS,   Endo/Other  negative endocrine ROS  Renal/GU negative Renal ROS  negative genitourinary   Musculoskeletal   Abdominal   Peds  Hematology negative hematology ROS (+)   Anesthesia Other Findings   Reproductive/Obstetrics (+) Pregnancy                             Anesthesia Physical Anesthesia Plan  ASA: II  Anesthesia Plan: Epidural   Post-op Pain Management:    Induction:   PONV Risk Score and Plan:   Airway Management Planned:   Additional Equipment:   Intra-op Plan:   Post-operative Plan:   Informed Consent: I have reviewed the patients History and Physical, chart, labs and discussed the procedure including the risks, benefits and alternatives for the proposed anesthesia with the patient or authorized representative who has indicated his/her understanding and acceptance.       Plan Discussed with: CRNA, Anesthesiologist and Surgeon  Anesthesia Plan Comments:         Anesthesia Quick Evaluation  

## 2018-05-30 NOTE — Anesthesia Procedure Notes (Signed)
Epidural Patient location during procedure: OB Start time: 05/30/2018 12:26 PM End time: 05/30/2018 12:30 PM  Staffing Anesthesiologist: Bethena Midgetddono, Chasya Zenz, MD  Preanesthetic Checklist Completed: patient identified, site marked, surgical consent, pre-op evaluation, timeout performed, IV checked, risks and benefits discussed and monitors and equipment checked  Epidural Patient position: sitting Prep: site prepped and draped and DuraPrep Patient monitoring: continuous pulse ox and blood pressure Approach: midline Location: L3-L4 Injection technique: LOR air  Needle:  Needle type: Tuohy  Needle gauge: 17 G Needle length: 9 cm and 9 Needle insertion depth: 5 cm Catheter type: closed end flexible Catheter size: 19 Gauge Catheter at skin depth: 10 cm Test dose: negative  Assessment Events: blood not aspirated, injection not painful, no injection resistance, negative IV test and no paresthesia

## 2018-05-30 NOTE — Progress Notes (Signed)
S: Doing well, no complaints, pain well controlled with epidural  O: BP 107/67   Pulse 77   Temp 97.6 F (36.4 C) (Oral)   Resp 18   Ht 5\' 7"  (1.702 m)   Wt 87.6 kg   SpO2 98%   BMI 30.24 kg/m    FHT:  FHR: 140s bpm, variability: moderate,  accelerations:  Present,  decelerations:  Absent UC:   regular, every 5 minutes SVE:   Dilation: 5 Effacement (%): 70 Station: -1 Exam by:: Folgamen, MD   AROM large volume clear fluid with some flecks of blood  A / P:  37 y.o.  OB History  Gravida Para Term Preterm AB Living  5 1 1  0 3 1  SAB TAB Ectopic Multiple Live Births  3 0 0 0 1   at [id number] Induction of labor due to elective,  progressing well on pitocin  Fetal Wellbeing:  Category I Pain Control:  Epidural  Anticipated MOD:  NSVD  Lendon ColonelKelly A Jaymie Misch 05/30/2018, 2:32 PM

## 2018-05-31 MED ORDER — IBUPROFEN 600 MG PO TABS: 600.0000 mg | ORAL_TABLET | Freq: Four times a day (QID) | ORAL | 0 refills | Status: AC

## 2018-05-31 NOTE — Lactation Note (Signed)
This note was copied from a baby's chart. Lactation Consultation Note  Patient Name: Betty Reed Today's Date: 05/31/2018   Telephone call from RN to observe feeding.  Infant crying on arrival.  Mom reports she is not sure what is wrong with him.  Infant also showing hunger cues.  Mom wants to sit in chair and try and feed him. Assist with cross cradle feeding in chair on right breast. Infant fussy and comes off and on the breast. Assist in laid back breastfeeding on right breast. Infant fussy and comes off and on the breast at first.  Assisted with shaping breast and giving infant good stability and he breastfed well and fell asleep.  Mom reports pain at beginning of the latch which subsided with feeding progression. Mom reports her back is hurting in the chair would like to sit in the bed.  Moms food came and infant asleep so held him for a few minutes while mom ate.  He started to get fussy and mom was done eating so gave him back to mom and went to get her resource information on tongue tie. Came back and mom feeding him in laid back breastfeeding on left breast.  Mom reports comfort.  Urged her to gently do chin tug and flange lips even in that position as needed.  Urged mom to do some pumping past breastfeeding until he is back up to birthweight and feeding well. Urged hand expression before  And past breastfeeding and rub emm on nipples and air dry. Mom reports that she really does not want to pump.  That she pumped for 4 months with her daughter and it was a struggle.  Urged mom just to pump past breastfeeding until he is feeding well and back up to birthweight.Urged mom to follow up with lactation as needed.  Maternal Data    Feeding Feeding Type: Breast Fed  LATCH Score                   Interventions    Lactation Tools Discussed/Used     Consult Status      Sherrie Marsan Michaelle CopasS Cory Kitt 05/31/2018, 11:08 PM

## 2018-05-31 NOTE — Lactation Note (Signed)
This note was copied from a baby's chart. Lactation Consultation Note  Patient Name: Boy Burley Savermber Branton ZOXWR'UToday's Date: 05/31/2018  [facility] Mom asleep.   Maternal Data    Feeding    LATCH Score                   Interventions    Lactation Tools Discussed/Used     Consult Status      Danelle EarthlyRobin Deegan Valentino 05/31/2018, 4:24 AM

## 2018-05-31 NOTE — Lactation Note (Signed)
This note was copied from a baby's chart. Lactation Consultation Note;   Mother assist with latching infant on the left beast in football hold. Infant latched , but assistance needed to adjust infants upper lip and lower jaw for wider gape.  Observed that infant has a slight v shape of the tip of the tongue . Infant has a anterior thin frenula .   Discussed with mother that infants tongue could be causing poor latching and soreness.  Advised to latch infant with good depth.  Mothers nipples are pink and tender. She describes #3 pain scale with the latch that eased into a #1. Observed infant with frequent suckles and swallows. Mother taught breast compression.    Infant placed on the alternate breast in cross cradle hold. Mother taught to use off sided latch.  Infant latch , but again adjusted low jaw for wider gape. Infant sustained latch for 10  mins and remained at the breast after LC left the room.   Advised mother to be aware that poor latch and slow weight gain could lead to further evaluation of infants tongue. Suggest that mother do some post pumping to protect her milk supply.   Mother has possible discharge this p,m. After weight.  Discussed cluster feeding, cue base feeding and advised to feed infant 8-12 times in 24 hours.  Discussed treatment and prevention of engorgement . [facility] .BFSG and OP depth.  Mother receptive to plan.   Patient Name: Betty Reed'UToday's Date: 05/31/2018 Reason for consult: Follow-up assessment   Maternal Data    Feeding Feeding Type: Breast Fed  LATCH Score Latch: Grasps breast easily, tongue down, lips flanged, rhythmical sucking.  Audible Swallowing: Spontaneous and intermittent  Type of Nipple: Everted at rest and after stimulation  Comfort (Breast/Nipple): Filling, red/small blisters or bruises, mild/mod discomfort(nipples pink and tender , comfort gels)  Hold (Positioning): Assistance  needed to correctly position infant at breast and maintain latch.  LATCH Score: 8  Interventions Interventions: Assisted with latch;Skin to skin;Hand express;Breast compression;Adjust position;Support pillows;Position options;Expressed milk;Comfort gels  Lactation Tools Discussed/Used     Consult Status Consult Status: Follow-up Date: 06/01/18 Follow-up type: In-patient    Stevan BornKendrick, Lawson Isabell [facility] 05/31/2018, 3:01 PM

## 2018-05-31 NOTE — Discharge Summary (Signed)
Obstetric Discharge Summary   Patient Name: Betty Reed DOB: 1980-11-10 MRN: [id number]  Date of Admission: 05/30/2018 Date of Discharge: 05/31/2018 Date of Delivery: 05/30/18 Gestational Age at Delivery: [redacted]w[redacted]d  Primary OB: [facility] - Dr. 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 Postpartum care following vaginal delivery (12/10) 05/31/2018  . SVD (spontaneous vaginal delivery) 05/31/2018  . Encounter for planned induction of labor 05/30/2018  . Second degree perineal laceration during delivery 08/04/2016    Augmentation: AROM, Pitocin  Complications: None  Date of Delivery: 05/30/09 Delivered By: Dr. Ernestina Penna Delivery Type: spontaneous vaginal delivery Anesthesia: epidural Placenta: sponatneous Laceration: 2nd degree  Episiotomy: none  Newborn Data: Live born female  Birth Weight: 8 lb 2.7 oz (3705 g) APGAR: 9, 9  Newborn Delivery   Birth date/time:  05/30/2018 16:53:00 Delivery type:  Vaginal, Spontaneous        Hospital/Postpartum Course  (Vaginal Delivery): Pt. Admitted for elective IOL at term. She progressed with Pitocin and AROM. See notes and delivery summary for details. Patient had an uncomplicated postpartum course.  By time of discharge on PPD#1, her pain was controlled on oral pain medications; she had appropriate lochia and was ambulating, voiding without difficulty and tolerating regular diet.  She was deemed stable for discharge to home.      Labs: CBC Latest Ref Rng & Units 05/30/2018 08/04/2016 08/03/2016  WBC 4.0 - 10.5 K/uL 6.0 12.9(H) 11.5(H)  Hemoglobin 12.0 - 15.0 g/dL 40.9 11.1(L) 13.3  Hematocrit 36.0 - 46.0 % 38.4 32.2(L) 38.3  Platelets 150 - 400 K/uL 153 156 167   O POS  Physical exam:  BP 119/60 (BP Location: Left Arm)   Pulse 80   Temp 97.8 F (36.6 C) (Oral)   Resp 18   Ht 5\' 7"  (1.702 m)   Wt 87.6 kg   SpO2 95%   Breastfeeding? Unknown   BMI 30.24 kg/m  General: alert and no distress Pulm: normal respiratory effort Lochia: appropriate Abdomen: soft, NT Uterine Fundus: firm, below umbilicus Perineum: healing well, mild edema, ecchymosis and erythema  Extremities: No evidence of DVT seen on physical exam. No lower extremity edema.   Disposition: stable, discharge to home Baby Feeding: breast milk Baby Disposition: home with mom  Contraception: not discussed  Rh Immune globulin given: N/A Rubella vaccine given: N/A Tdap vaccine given in AP or PP setting: UTD Flu vaccine given in AP or PP setting: UTD   Plan:  Betty Reed was discharged to home in good condition. Follow-up appointment at [facility] in 6 weeks.  Discharge Instructions: Per After Visit Summary. Refer to After Visit Summary and [facility] discharge booklet  Activity: Advance as tolerated. Pelvic rest for 6 weeks.   Diet: Regular, Heart Healthy Discharge Medications: Allergies as of 05/31/2018   No Known Allergies     Medication List    TAKE these medications   acetaminophen 325 MG tablet Commonly known as:  TYLENOL Take 650  mg by mouth every 6 (six) hours as needed for headache.   acidophilus Caps capsule Take 1 capsule by mouth daily.   aspirin EC 81 MG tablet Take 81 mg by mouth daily.   ibuprofen 600 MG tablet Commonly known as:  ADVIL,MOTRIN Take 1 tablet (600 mg total) by mouth every 6 (six) hours.   METHYLFOLATE PO Take 3 mg by mouth daily. Pt takes three 1 mg tablets daily.    prenatal multivitamin Tabs tablet Take 1 tablet by mouth daily at 12 noon.            Discharge Care Instructions  (From admission, onward)         Start     Ordered   05/31/18 0000  Discharge wound care:    Comments:  Alternate sitz baths and ice packs as needed   05/31/18 1210         Outpatient follow up:  Follow-up Information    Noland FordyceFogleman, Kelly, MD. Schedule an appointment as soon as possible for a visit in 6 week(s).   Specialty:  Obstetrics and Gynecology Why:  Postpartum visit  Contact information: [address] [place] [id number] [phone]           Signed:  Carlean JewsMeredith Sigmon, MSN, CNM [facility]

## 2018-05-31 NOTE — Anesthesia Postprocedure Evaluation (Signed)
Anesthesia Post Note  Patient: Betty Reed  Procedure(s) Performed: AN AD HOC LABOR EPIDURAL     Patient location during evaluation: Mother Baby Anesthesia Type: Epidural Level of consciousness: awake, awake and alert and oriented Pain management: pain level controlled Vital Signs Assessment: post-procedure vital signs reviewed and stable Respiratory status: spontaneous breathing and respiratory function stable Cardiovascular status: blood pressure returned to baseline Postop Assessment: no headache, epidural receding, patient able to bend at knees, adequate PO intake, no backache, no apparent nausea or vomiting and able to ambulate Anesthetic complications: no    Last Vitals:  Vitals:   05/31/18 0400 05/31/18 0805  BP: (!) 106/58 119/60  Pulse: 72 80  Resp: 16 18  Temp: 36.7 C 36.6 C  SpO2: 95%     Last Pain:  Vitals:   05/31/18 0805  TempSrc: Oral  PainSc: 0-No pain   Pain Goal:                 Cleda ClarksBrowder, Garett Tetzloff R

## 2018-05-31 NOTE — Progress Notes (Signed)
MOB was referred for history of depression/anxiety. * Referral screened out by Clinical Social Worker because none of the following criteria appear to apply: ~ History of anxiety/depression during this pregnancy, or of post-partum depression following prior delivery. ~ Diagnosis of anxiety and/or depression within last 3 years OR * MOB's symptoms currently being treated with medication and/or therapy. Please contact the Clinical Social Worker if needs arise, by MOB request, or if MOB scores greater than 9/yes to question 10 on Edinburgh Postpartum Depression Screen.  Hubbard Seldon Boyd-Gilyard, MSW, LCSW Clinical Social Work [phone]  

## 2018-05-31 NOTE — Progress Notes (Signed)
PPD #1, SVD, 2nd degree repair, baby boy "Clovis RileyMitchell"  S:  Reports feeling good, tired and sore, desires early discharge home at 24 hours              Tolerating po/ No nausea or vomiting / Denies dizziness or SOB             Bleeding is moderate             Pain controlled with Motrin             Up ad lib / ambulatory / voiding QS  Newborn breast feeding - reports latching is going well, but states peds is concerned for weight loss  / Circumcision - completed today  O:               VS: BP 119/60 (BP Location: Left Arm)   Pulse 80   Temp 97.8 F (36.6 C) (Oral)   Resp 18   Ht 5\' 7"  (1.702 m)   Wt 87.6 kg   SpO2 95%   Breastfeeding? Unknown   BMI 30.24 kg/m    LABS:              Recent Labs    05/30/18 0810  WBC 6.0  HGB 12.8  PLT 153               Blood type: --/--/O POS (12/10 0810)  Rubella: Immune (05/20 0000)                     I&O: Intake/Output      12/10 0701 - 12/11 0700 12/11 0701 - 12/12 0700   Urine (mL/kg/hr) 850 (0.4)    Blood 453    Total Output 1303    Net -1303          Flu and Tdap UTD 2019             Physical Exam:             Alert and oriented X3  Lungs: Clear and unlabored  Heart: regular rate and rhythm / no murmurs  Abdomen: soft, non-tender, non-distended              Fundus: firm, non-tender, U-1  Perineum: well approximated 2nd degree repair, mild erythema and ecchymosis, mild edema, ice pack in place  Lochia: small, no clots   Extremities: no edema, no calf pain or tenderness    A: PPD # 1, SVD  2nd degree perineal repair  Hx. Of MTHFR mutation  Doing well - stable status  P: Routine post partum orders  See lactation today  May shower   Discharge home at 24 hours if baby is able to be discharged, discussed we will cancel d/c if baby stays   [facility] discharge book given, instructions and warning s/s reviewed   F/u in 6 weeks with Dr. Roosvelt MaserFogleman   Gadge Hermiz, MSN, CNM [facility]

## 2018-05-31 NOTE — Progress Notes (Signed)
Late entry: At 5:41pm, notified by RN that patient has decided to stay inpatient. Early discharge home canceled for today.  Will plan for discharge tomorrow.   Carlean JewsMeredith Maresha Anastos, CNM

## 2018-06-01 NOTE — Discharge Summary (Signed)
Obstetric Discharge Summary Reason for Admission: onset of labor Prenatal Procedures: none Intrapartum Procedures: spontaneous vaginal delivery and epidural Postpartum Procedures: none Complications-Operative and Postpartum: 2nd degree perineal laceration Hemoglobin  Date Value Ref Range Status  05/30/2018 12.8 12.0 - 15.0 g/dL Final   HCT  Date Value Ref Range Status  05/30/2018 38.4 36.0 - 46.0 % Final    Physical Exam:  General: alert, cooperative and no distress Lochia: appropriate Uterine Fundus: firm Incision: healing well DVT Evaluation: No evidence of DVT seen on physical exam.  Discharge Diagnoses: Term Pregnancy-delivered  Discharge Information: Date: 06/01/2018 Activity: pelvic rest Diet: routine Medications: PNV, Ibuprofen and ASA Condition: stable Instructions: refer to practice specific booklet Discharge to: home Follow-up Information    Betty Reed, Kelly, Betty Reed. Schedule an appointment as soon as possible for a visit in 6 week(s).   Specialty:  Obstetrics and Gynecology Why:  Postpartum visit  Contact information: Betty Severe[address] [place] [id number] [phone]           Newborn Data: Live born female  Birth Weight: 8 lb 2.7 oz (3705 g) APGAR: 9, 9  Newborn Delivery   Birth date/time:  05/30/2018 16:53:00 Delivery type:  Vaginal, Spontaneous     Home with mother.  Betty Reed 06/01/2018, 8:32 AM

## 2018-06-01 NOTE — Progress Notes (Signed)
PPD 2 SVD  S:  Reports feeling well             Tolerating po/ No nausea or vomiting             Bleeding is light             Pain controlled with Motrin             Up ad lib / ambulatory / voiding QS  Newborn Breast   O:     VS: BP 111/77 (BP Location: Left Arm)   Pulse 73   Temp 98 F (36.7 C) (Oral)   Resp 16   Ht 5\' 7"  (1.702 m)   Wt 87.6 kg   SpO2 97%   Breastfeeding Unknown   BMI 30.24 kg/m               Physical Exam:             Alert and oriented X3             Fundus: firm, non-tender, U-1  Perineum: no edema  Lochia: light  Extremities: no edema, no calf pain or tenderness   A: PPD # 2   Doing well - stable status  P: Routine post partum orders  DC home  Betty Reed CNM, MSN, [facility] 06/01/2018, 8:26 AM

## 2018-06-02 ENCOUNTER — Inpatient Hospital Stay (HOSPITAL_COMMUNITY): Admission: AD | Admit: 2018-06-02 | Payer: BC Managed Care – PPO | Source: Ambulatory Visit | Admitting: Obstetrics

## 2019-07-03 ENCOUNTER — Other Ambulatory Visit: Payer: Self-pay | Admitting: Chiropractic Medicine

## 2019-07-03 DIAGNOSIS — M546 Pain in thoracic spine: Secondary | ICD-10-CM

## 2019-07-03 DIAGNOSIS — M542 Cervicalgia: Secondary | ICD-10-CM

## 2019-07-31 ENCOUNTER — Other Ambulatory Visit: Payer: BC Managed Care – PPO

## 2019-08-03 ENCOUNTER — Other Ambulatory Visit: Payer: Self-pay | Admitting: Neurosurgery

## 2019-08-03 DIAGNOSIS — M5412 Radiculopathy, cervical region: Secondary | ICD-10-CM

## 2019-08-08 ENCOUNTER — Ambulatory Visit
Admission: RE | Admit: 2019-08-08 | Discharge: 2019-08-08 | Disposition: A | Discharge status: Home / Self Care | Payer: BC Managed Care – PPO | Source: Ambulatory Visit | Attending: Neurosurgery | Admitting: Neurosurgery

## 2019-08-08 ENCOUNTER — Other Ambulatory Visit: Payer: Self-pay

## 2019-08-08 DIAGNOSIS — M5412 Radiculopathy, cervical region: Secondary | ICD-10-CM

## 2019-09-17 ENCOUNTER — Ambulatory Visit: Payer: BC Managed Care – PPO | Attending: Internal Medicine

## 2019-09-17 DIAGNOSIS — Z23 Encounter for immunization: Secondary | ICD-10-CM

## 2019-09-17 NOTE — Progress Notes (Signed)
   Covid-19 Vaccination Clinic  Name:  Betty Reed    MRN: [id number] DOB: December 01, 1980  09/17/2019  [name] was observed post Covid-19 immunization for 15 minutes without incident. She was provided with Vaccine Information Sheet and instruction to access the V-Safe system.   [name] was instructed to call 911 with any severe reactions post vaccine: Marland Kitchen Difficulty breathing  . Swelling of face and throat  . A fast heartbeat  . A bad rash all over body  . Dizziness and weakness   Immunizations Administered    Name Date Dose VIS Date Route   Pfizer COVID-19 Vaccine 09/17/2019  9:51 AM 0.3 mL 06/01/2019 Intramuscular   Manufacturer: [employer], [employer]   Lot: [id number]   NDC: [address]-1

## 2019-10-09 ENCOUNTER — Ambulatory Visit: Payer: BC Managed Care – PPO

## 2021-04-29 IMAGING — MR MR CERVICAL SPINE W/O CM
1 series · 17 of 17 positions shown · non-contrast
Comparison: None.

CLINICAL DATA: Neck pain left arm pain

EXAM:
MRI CERVICAL SPINE WITHOUT CONTRAST
TECHNIQUE: Multiplanar, multisequence MR imaging of the cervical spine was
performed. No intravenous contrast was administered.

[Series 2: T2 · sagittal · 3.0mm · 0.41mm/px · 17 of 17 slices shown]
[im 1/17]
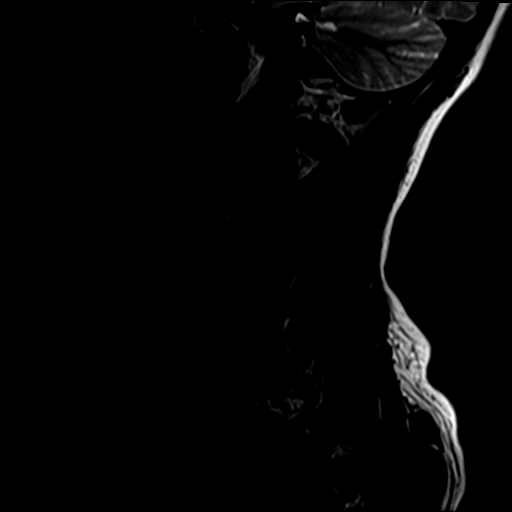
[im 2/17]
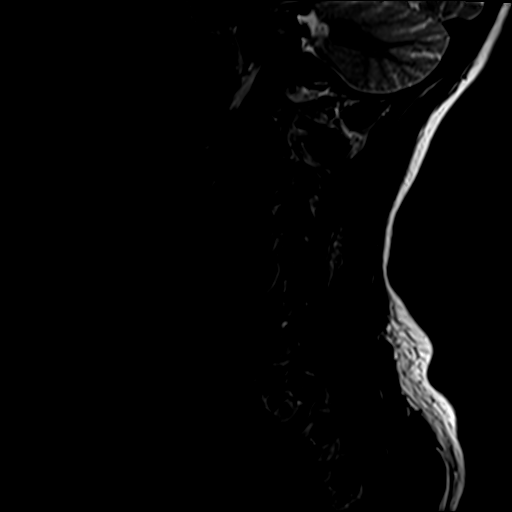
[im 3/17]
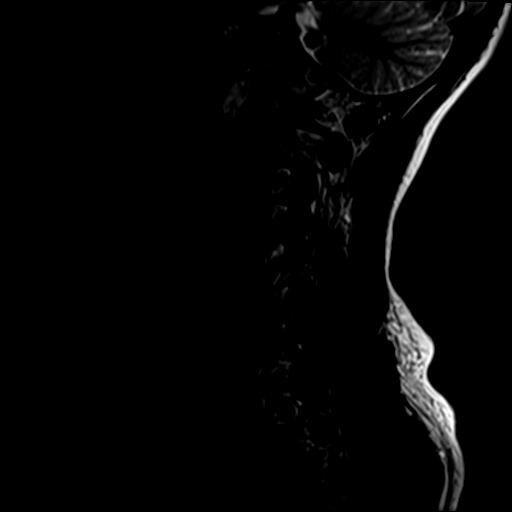
[im 4/17]
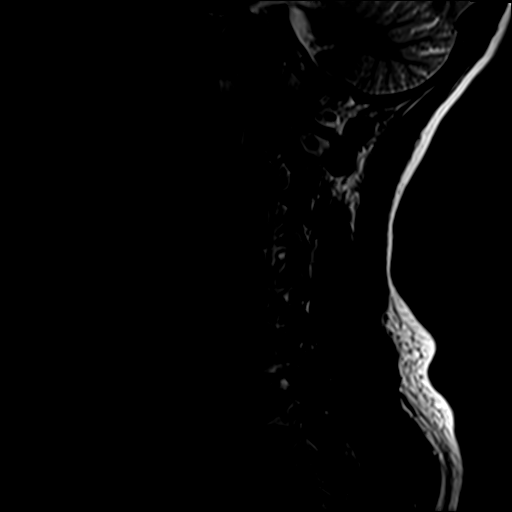
[im 5/17]
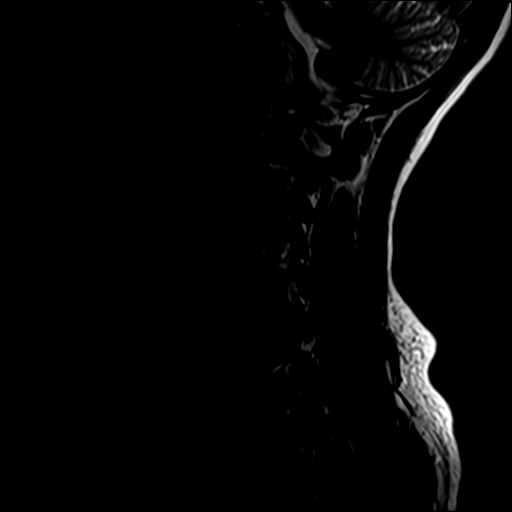
[im 6/17]
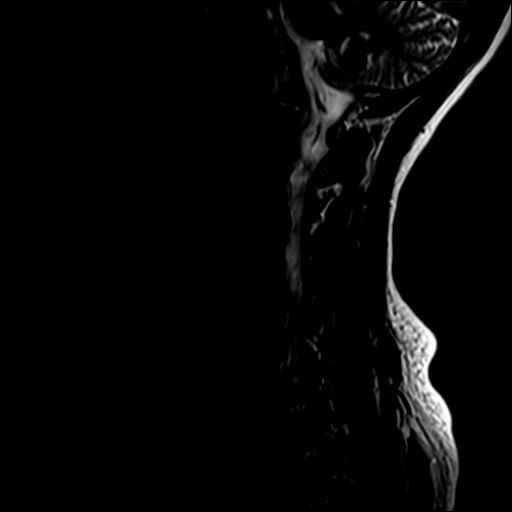
[im 7/17]
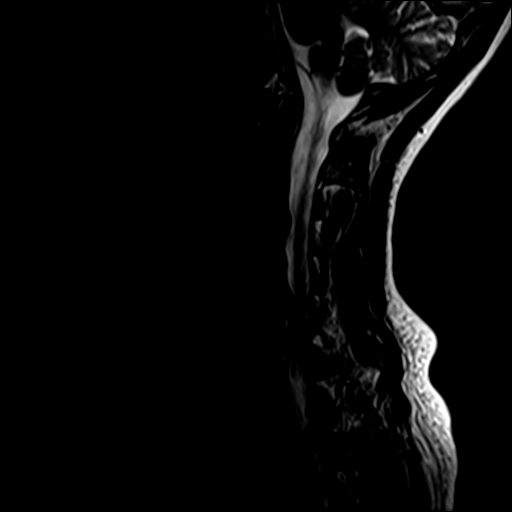
[im 8/17]
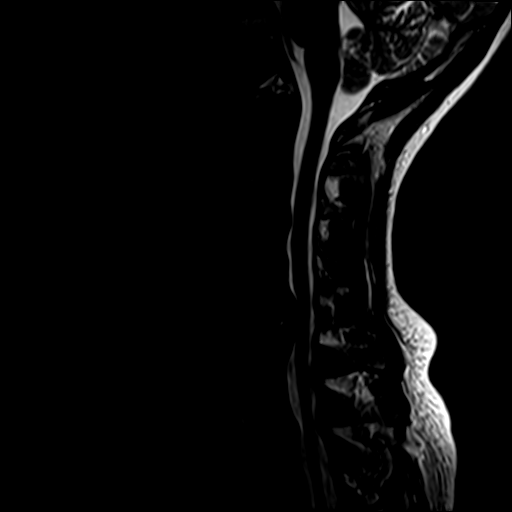
[im 9/17]
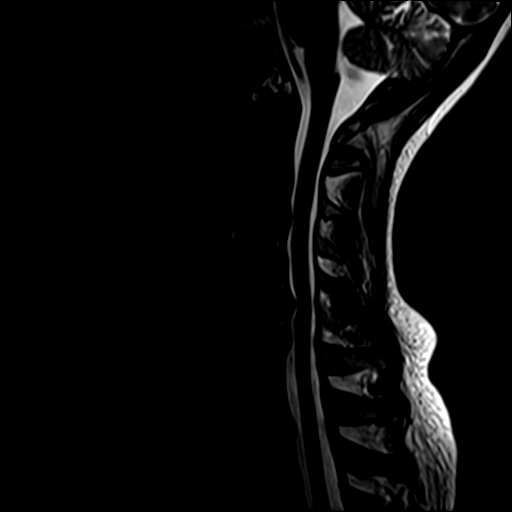
[im 10/17]
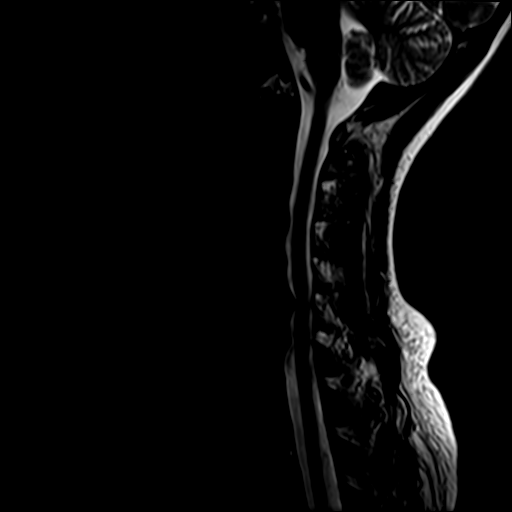
[im 11/17]
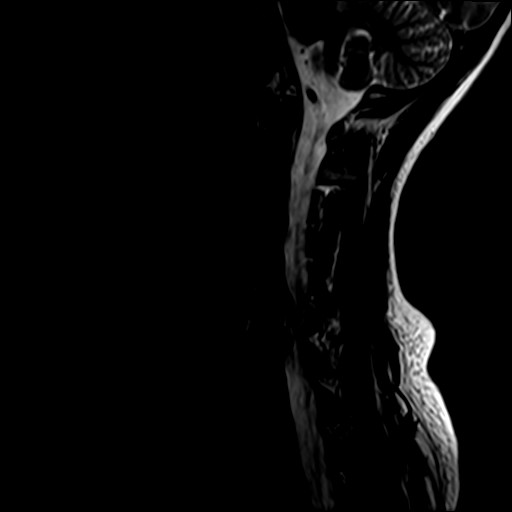
[im 12/17]
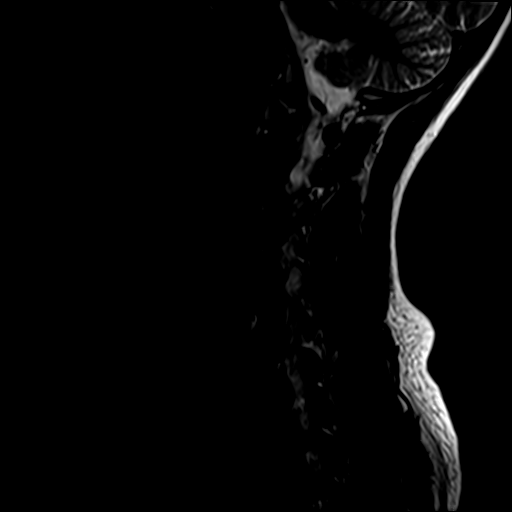
[im 13/17]
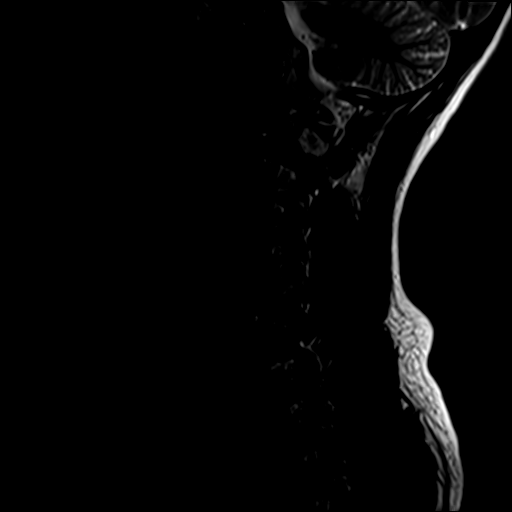
[im 14/17]
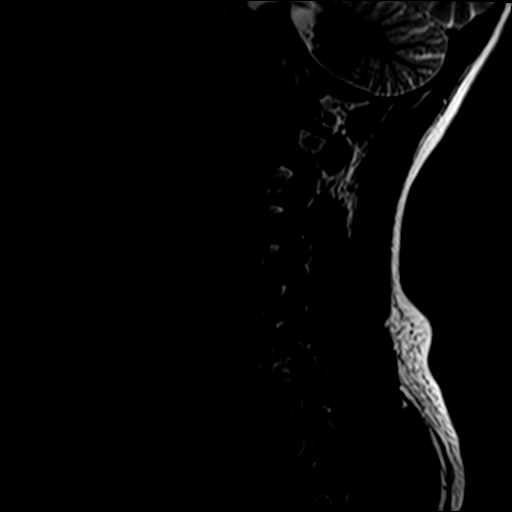
[im 15/17]
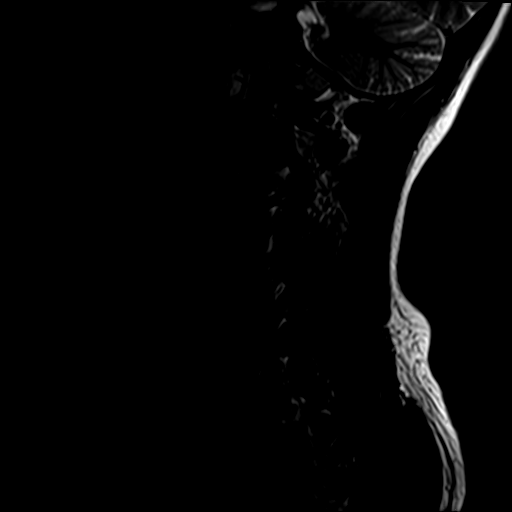
[im 16/17]
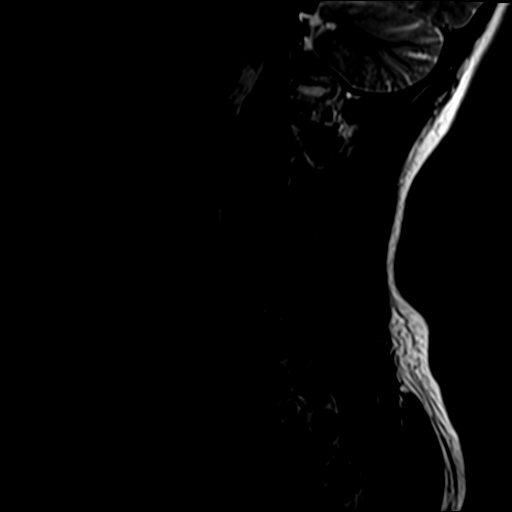
[im 17/17]
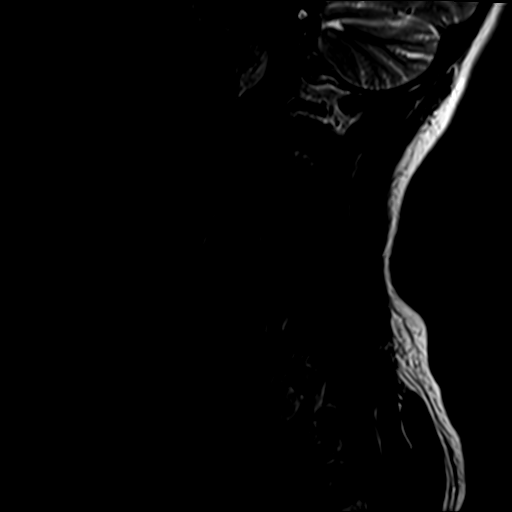

[17 of 17 positions shown; findings below may reference images not displayed]

FINDINGS: Alignment: 2 mm retrolisthesis C3-4. Slight retrolisthesis C5-6 and
C6-7

Vertebrae: Normal bone marrow.  Negative for fracture or mass.

Cord: Normal signal and morphology.

Posterior Fossa, vertebral arteries, paraspinal tissues: Negative

Disc levels:

C2-3: Negative

C3-4: Mild retrolisthesis with disc degeneration and uncinate
spurring. Mild foraminal narrowing bilaterally due to spurring.

C4-5: Negative

C5-6: Moderate disc degeneration with diffuse uncinate spurring.
Cord flattening with moderate spinal stenosis. Mild foraminal
narrowing bilaterally.

C6-7: Moderate disc degeneration with diffuse uncinate spurring
right greater than left. Cord flattening on the right with mild
spinal stenosis. Mild to moderate foraminal narrowing bilaterally.

C7-T1: Negative
IMPRESSION: Multilevel degenerative changes and spurring in the cervical spine

Mild foraminal narrowing bilaterally at C3-4

Moderate spinal stenosis at C5-6 with mild foraminal narrowing
bilaterally due to spurring

Mild spinal stenosis C6-7 right greater than left with mild to
moderate foraminal narrowing bilaterally.
